# Patient Record
Sex: Male | Born: 1965 | Race: White | Hispanic: No | Marital: Married | Smoking: Never smoker
Health system: Southern US, Community
[De-identification: ages and names within clinical notes are randomized; demographics above are authoritative.]

## PROBLEM LIST (undated history)

## (undated) HISTORY — PX: HAND SURGERY: SHX662

## (undated) HISTORY — PX: APPENDECTOMY: SHX54

---

## 2012-07-29 ENCOUNTER — Ambulatory Visit (HOSPITAL_BASED_OUTPATIENT_CLINIC_OR_DEPARTMENT_OTHER)
Admission: RE | Admit: 2012-07-29 | Discharge: 2012-07-29 | Disposition: A | Discharge status: Home / Self Care | Payer: Self-pay | Source: Ambulatory Visit | Attending: Occupational Medicine | Admitting: Occupational Medicine

## 2012-07-29 ENCOUNTER — Other Ambulatory Visit: Payer: Self-pay | Admitting: Occupational Medicine

## 2012-07-29 DIAGNOSIS — Z0000 Encounter for general adult medical examination without abnormal findings: Secondary | ICD-10-CM

## 2016-06-10 ENCOUNTER — Emergency Department (INDEPENDENT_AMBULATORY_CARE_PROVIDER_SITE_OTHER): Payer: Managed Care, Other (non HMO)

## 2016-06-10 ENCOUNTER — Encounter: Payer: Self-pay | Admitting: Emergency Medicine

## 2016-06-10 ENCOUNTER — Emergency Department
Admission: EM | Admit: 2016-06-10 | Discharge: 2016-06-10 | Disposition: A | Discharge status: Home / Self Care | Payer: Managed Care, Other (non HMO) | Source: Home / Self Care | Attending: Family Medicine | Admitting: Family Medicine

## 2016-06-10 DIAGNOSIS — L03119 Cellulitis of unspecified part of limb: Secondary | ICD-10-CM

## 2016-06-10 DIAGNOSIS — M25522 Pain in left elbow: Secondary | ICD-10-CM | POA: Diagnosis not present

## 2016-06-10 MED ORDER — CLINDAMYCIN HCL 300 MG PO CAPS: 300.0000 mg | ORAL_CAPSULE | Freq: Four times a day (QID) | ORAL | Status: AC

## 2016-06-10 NOTE — Discharge Instructions (Signed)
Wear ace wrap until swelling resolves.  Apply heating pad several times daily.  May take Ibuprofen 200mg , 4 tabs every 8 hours with food.  If symptoms become significantly worse during the night or over the weekend, proceed to the local emergency room.    Cellulitis Cellulitis is an infection of the skin and the tissue beneath it. The infected area is usually red and tender. Cellulitis occurs most often in the arms and lower legs.  CAUSES  Cellulitis is caused by bacteria that enter the skin through cracks or cuts in the skin. The most common types of bacteria that cause cellulitis are staphylococci and streptococci. SIGNS AND SYMPTOMS   Redness and warmth.  Swelling.  Tenderness or pain.  Fever. DIAGNOSIS  Your health care provider can usually determine what is wrong based on a physical exam. Blood tests may also be done. TREATMENT  Treatment usually involves taking an antibiotic medicine. HOME CARE INSTRUCTIONS   Take your antibiotic medicine as directed by your health care provider. Finish the antibiotic even if you start to feel better.  Keep the infected arm or leg elevated to reduce swelling.  Apply a warm cloth to the affected area up to 4 times per day to relieve pain.  Take medicines only as directed by your health care provider.  Keep all follow-up visits as directed by your health care provider. SEEK MEDICAL CARE IF:   You notice red streaks coming from the infected area.  Your red area gets larger or turns dark in color.  Your bone or joint underneath the infected area becomes painful after the skin has healed.  Your infection returns in the same area or another area.  You notice a swollen bump in the infected area.  You develop new symptoms.  You have a fever. SEEK IMMEDIATE MEDICAL CARE IF:   You feel very sleepy.  You develop vomiting or diarrhea.  You have a general ill feeling (malaise) with muscle aches and pains.   This information is not  intended to replace advice given to you by your health care provider. Make sure you discuss any questions you have with your health care provider.   Document Released: 08/16/2005 Document Revised: 07/28/2015 Document Reviewed: 01/22/2012 Elsevier Interactive Patient Education [employer]

## 2016-06-10 NOTE — ED Notes (Signed)
Pt c/o left elbow pain from falling off a [place] on Thursday.  Elbow has redness and swelling, pain.  Pt can bend but cannot straighten out.

## 2016-07-11 NOTE — ED Provider Notes (Signed)
[facility]    CSN: [id number] Arrival date & time: 06/10/16  1329  First Provider Contact:  First MD Initiated Contact with Patient 06/10/16 1526        History   Chief Complaint Chief Complaint  Patient presents with  . Elbow Pain    HPI Mario Melendez is a 50 y.o. male.   Patient fell [place] two days ago, striking his left elbow on a creek bottom.  He has had persistent pain/swelling in his elbow, and developed fever/chills last night.   The history is provided by the patient.  Arm Injury  Location:  Elbow Elbow location:  L elbow Injury: yes   Time since incident:  2 days Mechanism of injury: fall   Fall:    Fall occurred: from a [place].   Impact surface: creek bed. Pain details:    Quality:  Aching   Radiates to:  Does not radiate   Severity:  Moderate   Onset quality:  Sudden   Timing:  Constant   Progression:  Worsening Foreign body present:  No foreign bodies Prior injury to area:  No Relieved by:  Nothing Exacerbated by: extending elbow. Ineffective treatments:  NSAIDs Associated symptoms: decreased range of motion, fever, stiffness and swelling   Associated symptoms: no numbness and no tingling     History reviewed. No pertinent past medical history.  There are no active problems to display for this patient.   Past Surgical History:  Procedure Laterality Date  . APPENDECTOMY    . HAND SURGERY         Home Medications    Prior to Admission medications   Medication Sig Start Date End Date Taking? Authorizing Provider  clindamycin (CLEOCIN) 300 MG capsule Take 1 capsule (300 mg total) by mouth 4 (four) times daily. (every 6 hours) 06/10/16   Lattie HawStephen A Geetika Laborde, MD    Family History No family history on file.  Social History Social History  Substance Use Topics  . Smoking status: Never Smoker  . Smokeless tobacco: Not on file  . Alcohol use Yes     Comment: 1 drink per week     Allergies   Review of patient's  allergies indicates no known allergies.   Review of Systems Review of Systems  Constitutional: Positive for fever.  Musculoskeletal: Positive for stiffness.  All other systems reviewed and are negative.    Physical Exam Triage Vital Signs ED Triage Vitals  Enc Vitals Group     BP 06/10/16 1358 129/78     Pulse Rate 06/10/16 1358 61     Resp --      Temp 06/10/16 1358 98.6 F (37 C)     Temp Source 06/10/16 1358 Oral     SpO2 06/10/16 1358 97 %     Weight 06/10/16 1358 275 lb 8 oz (125 kg)     Height 06/10/16 1358 5\' 11"  (1.803 m)     Head Circumference --      Peak Flow --      Pain Score 06/10/16 1400 6     Pain Loc --      Pain Edu? --      Excl. in GC? --    No data found.   Updated Vital Signs BP 129/78 (BP Location: Left Arm)   Pulse 61   Temp 98.6 F (37 C) (Oral)   Ht 5\' 11"  (1.803 m)   Wt 275 lb 8 oz (125 kg)   SpO2 97%  BMI 38.42 kg/m   Visual Acuity Right Eye Distance:   Left Eye Distance:   Bilateral Distance:    Right Eye Near:   Left Eye Near:    Bilateral Near:     Physical Exam  Constitutional: He appears well-developed and well-nourished. No distress.  HENT:  Head: Atraumatic.  Eyes: Pupils are equal, round, and reactive to light.  Neck: Neck supple.  Cardiovascular: Normal heart sounds.   Pulmonary/Chest: Breath sounds normal.  Musculoskeletal:       Left elbow: He exhibits decreased range of motion and swelling. Tenderness found.       Arms: Left elbow has diffuse swelling, erythema, warmth, and tenderness to palpation superficially over olecranon.  Olecranon bursa is not swollen.  Patient has difficulty fully extending elbow.  Distal neurovascular function is intact.   Neurological: He is alert.  Skin: Skin is warm and dry.  Nursing note and vitals reviewed.    UC Treatments / Results  Labs (all labs ordered are listed, but only abnormal results are displayed) Labs Reviewed - No data to display  EKG  EKG  Interpretation None       Radiology Study Result   CLINICAL DATA:  Fall from [place] on Thursday, left elbow injury, posterior pain with swelling and redness.  EXAM: LEFT ELBOW - COMPLETE 3+ VIEW  COMPARISON:  None.  FINDINGS: Osseous alignment is normal. No fracture line or displaced fracture fragment seen. No degenerative change. No appreciable joint effusion. Soft tissue swelling/edema noted posteriorly.  IMPRESSION: Soft tissue swelling.  No osseous fracture or dislocation.   Electronically Signed   By: Bary RichardStan  Maynard M.D.   On: 06/10/2016 15:27     Procedures Procedures (including critical care time)  Medications Ordered in UC Medications - No data to display   Initial Impression / Assessment and Plan / UC Course  I have reviewed the triage vital signs and the nursing notes.  Pertinent labs & imaging results that were available during my care of the patient were reviewed by me and considered in my medical decision making (see chart for details).  Clinical Course   Begin Clindaymcin.  May continue Ibuprofen 200mg , 4 tabs every 8 hours with food.  Begin applying warm compress or heating pad several times daily. Followup with Dr. Rodney Langtonhomas Thekkekandam or Dr. Clementeen GrahamEvan Corey ([facility]) if not improving about one week. If symptoms become significantly worse during the night or over the weekend, proceed to the local emergency room.   Final Clinical Impressions(s) / UC Diagnoses   Final diagnoses:  Cellulitis of elbow    New Prescriptions Discharge Medication List as of 06/10/2016  3:48 PM    START taking these medications   Details  clindamycin (CLEOCIN) 300 MG capsule Take 1 capsule (300 mg total) by mouth 4 (four) times daily. (every 6 hours), Starting 06/10/2016, Until Discontinued, Print         Lattie HawStephen A Tiburcio Linder, MD 07/11/16 [phone]

## 2016-08-03 ENCOUNTER — Emergency Department (INDEPENDENT_AMBULATORY_CARE_PROVIDER_SITE_OTHER)
Admission: EM | Admit: 2016-08-03 | Discharge: 2016-08-03 | Disposition: A | Discharge status: Home / Self Care | Payer: Managed Care, Other (non HMO) | Source: Home / Self Care | Attending: Family Medicine | Admitting: Family Medicine

## 2016-08-03 ENCOUNTER — Emergency Department (INDEPENDENT_AMBULATORY_CARE_PROVIDER_SITE_OTHER): Payer: Managed Care, Other (non HMO)

## 2016-08-03 DIAGNOSIS — W19XXXA Unspecified fall, initial encounter: Secondary | ICD-10-CM

## 2016-08-03 DIAGNOSIS — S52612A Displaced fracture of left ulna styloid process, initial encounter for closed fracture: Secondary | ICD-10-CM | POA: Diagnosis not present

## 2016-08-03 DIAGNOSIS — T07XXXA Unspecified multiple injuries, initial encounter: Secondary | ICD-10-CM

## 2016-08-03 DIAGNOSIS — W309XXA Contact with unspecified agricultural machinery, initial encounter: Secondary | ICD-10-CM

## 2016-08-03 DIAGNOSIS — S6992XA Unspecified injury of left wrist, hand and finger(s), initial encounter: Secondary | ICD-10-CM | POA: Diagnosis not present

## 2016-08-03 NOTE — ED Provider Notes (Signed)
CSN: [id number]     Arrival date & time 08/03/16  1854 History   First MD Initiated Contact with Patient 08/03/16 1921     Chief Complaint  Patient presents with  . Fall   (Consider location/radiation/quality/duration/timing/severity/associated sxs/prior Treatment) HPI Jocelyn LamerJames Rowzee is a 50 y.o. male presenting to UC with c/o Left wrist pain and swelling that started around 6PM today after pt jumped off farm equipment he was on that was being pulled by mules.  Pt states the mules were getting caught up in part of the cart he was on so he decided to jump off before he got more hurt.  He now has superficial scrapes to Left side of face, Left side/anterior neck pain from stretching his neck during the fall, Left side pain and Left wrist pain that is most bothersome for the pt. Denies LOC. Denies dizziness or change in vision. He did take Tramadol PTA (which he has for chronic Right hand pain).  Pain at this time is mild to moderate pressure. Denies numbness or weakness to arms or legs. No back pain.   History reviewed. No pertinent past medical history. Past Surgical History:  Procedure Laterality Date  . APPENDECTOMY    . HAND SURGERY     History reviewed. No pertinent family history. Social History  Substance Use Topics  . Smoking status: Never Smoker  . Smokeless tobacco: Never Used  . Alcohol use Yes     Comment: 1 drink per week    Review of Systems  Musculoskeletal: Positive for arthralgias, joint swelling, myalgias and neck pain. Negative for back pain, gait problem and neck stiffness.  Skin: Positive for wound. Negative for color change and rash.  Neurological: Negative for weakness and numbness.    Allergies  Hydrocodone  Home Medications   Prior to Admission medications   Medication Sig Start Date End Date Taking? Authorizing Provider  clindamycin (CLEOCIN) 300 MG capsule Take 1 capsule (300 mg total) by mouth 4 (four) times daily. (every 6 hours) 06/10/16   Lattie HawStephen A  Beese, MD   Meds Ordered and Administered this Visit  Medications - No data to display  BP 142/97 (BP Location: Right Arm)   Pulse 82   Temp 98.1 F (36.7 C) (Oral)   Ht 5\' 11"  (1.803 m)   Wt 267 lb (121.1 kg)   SpO2 97%   BMI 37.24 kg/m  No data found.   Physical Exam  Constitutional: He is oriented to person, place, and time. He appears well-developed and well-nourished.  HENT:  Head: Normocephalic and atraumatic.  Eyes: EOM are normal.  Neck: Normal range of motion.  Cardiovascular: Normal rate.   Pulmonary/Chest: Effort normal.  Musculoskeletal: Normal range of motion. He exhibits edema and tenderness.  Left wrist: mild edema to radial dorsal aspect of wrist, tender. Full ROM. No snuffbox tenderness.  Full ROM elbow and shoulder w/o pain. 5/5 grip strength.  Neurological: He is alert and oriented to person, place, and time.  Skin: Skin is warm and dry. Capillary refill takes less than 2 seconds.  Left side of face: multiple superficial abrasions.   Psychiatric: He has a normal mood and affect. His behavior is normal.  Nursing note and vitals reviewed.   Urgent Care Course   Clinical Course    Procedures (including critical care time)  Labs Review Labs Reviewed - No data to display  Imaging Review Dg Wrist Complete Left  Result Date: 08/03/2016 CLINICAL DATA:  Left wrist pain and swelling after  injury landing on left side. EXAM: LEFT WRIST - COMPLETE 3+ VIEW COMPARISON:  None. FINDINGS: Transverse sclerotic band in the distal radius metaphysis suspicious for impaction fracture, acuity uncertain. There is a remote appearing ulna styloid fracture. Carpal bones including the scaphoid is intact. Soft tissue edema noted about the wrist IMPRESSION: Transverse sclerotic band in the distal radius is suspicious for impaction fracture, acuity uncertain. This may be acute or sequela of remote distal radius fracture. Remote appearing ulna styloid fracture. Electronically  Signed   By: Rubye Oaks M.D.   On: 08/03/2016 20:11     MDM   1. Left wrist injury, initial encounter   2. Fall, initial encounter   3. Abrasions of multiple sites    Fall from farm equipment. No LOC. Superficial abrasions to Left side of face. Pt's main concern, Left wrist.  Plain films: questionable impaction fracture with transverse sclerotic band in distal radius  Discussed imaging with pt.  Pt agreeable to wear thumb spica wrist splint for at least 2 weeks. If pain not improving, will f/u with Sports Medicine for recheck of symptoms, sooner if worsening. Patient verbalized understanding and agreement with treatment plan. Pt has tramadol at home as needed for pain.     Junius Finner, PA-C 08/04/16 1209

## 2016-08-03 NOTE — ED Triage Notes (Signed)
Patient state he was moving mules and fell off of one, landed on his left side, complains of left side pain, left arm,wrist,neck and shoulder pain. States more pressure than pain. States took Tramadol unsure of dosage, fall happened at approximately 5:30/6:30 today

## 2017-09-07 IMAGING — DX DG WRIST COMPLETE 3+V*L*
4 series · 4 of 4 positions shown · non-contrast
Comparison: None.

CLINICAL DATA: Left wrist pain and swelling after injury landing on
left side.

EXAM:
LEFT WRIST - COMPLETE 3+ VIEW

[wrist pa]
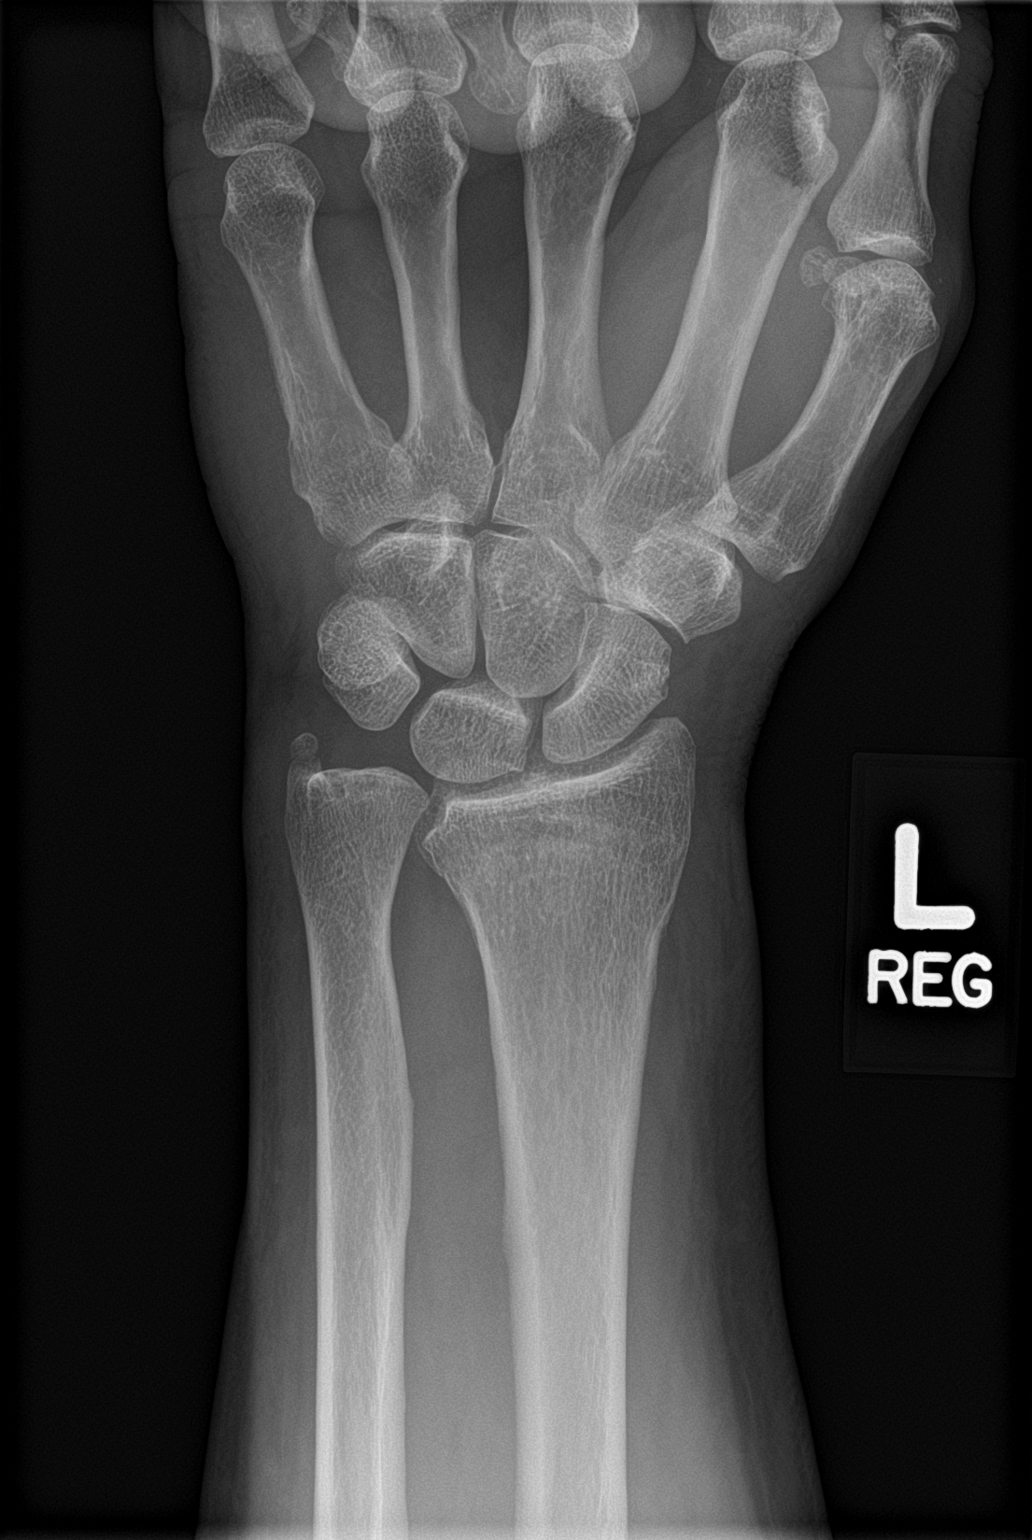

[wrist obl]
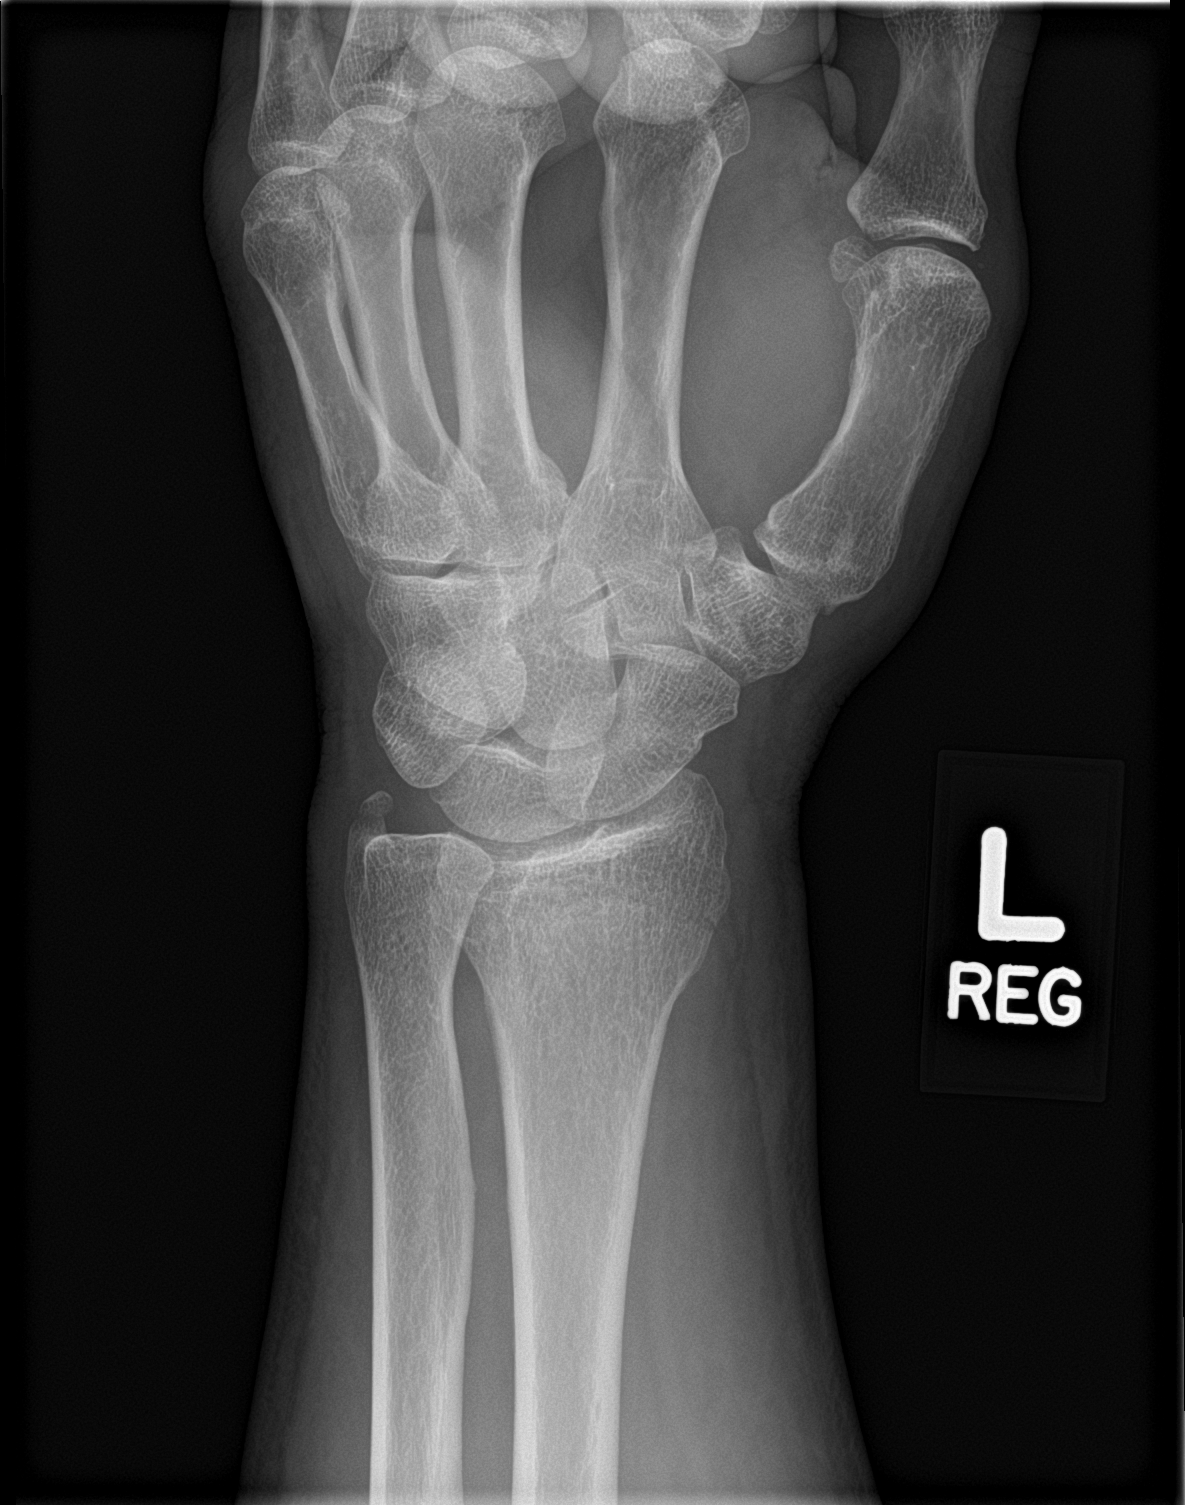

[wrist lat]
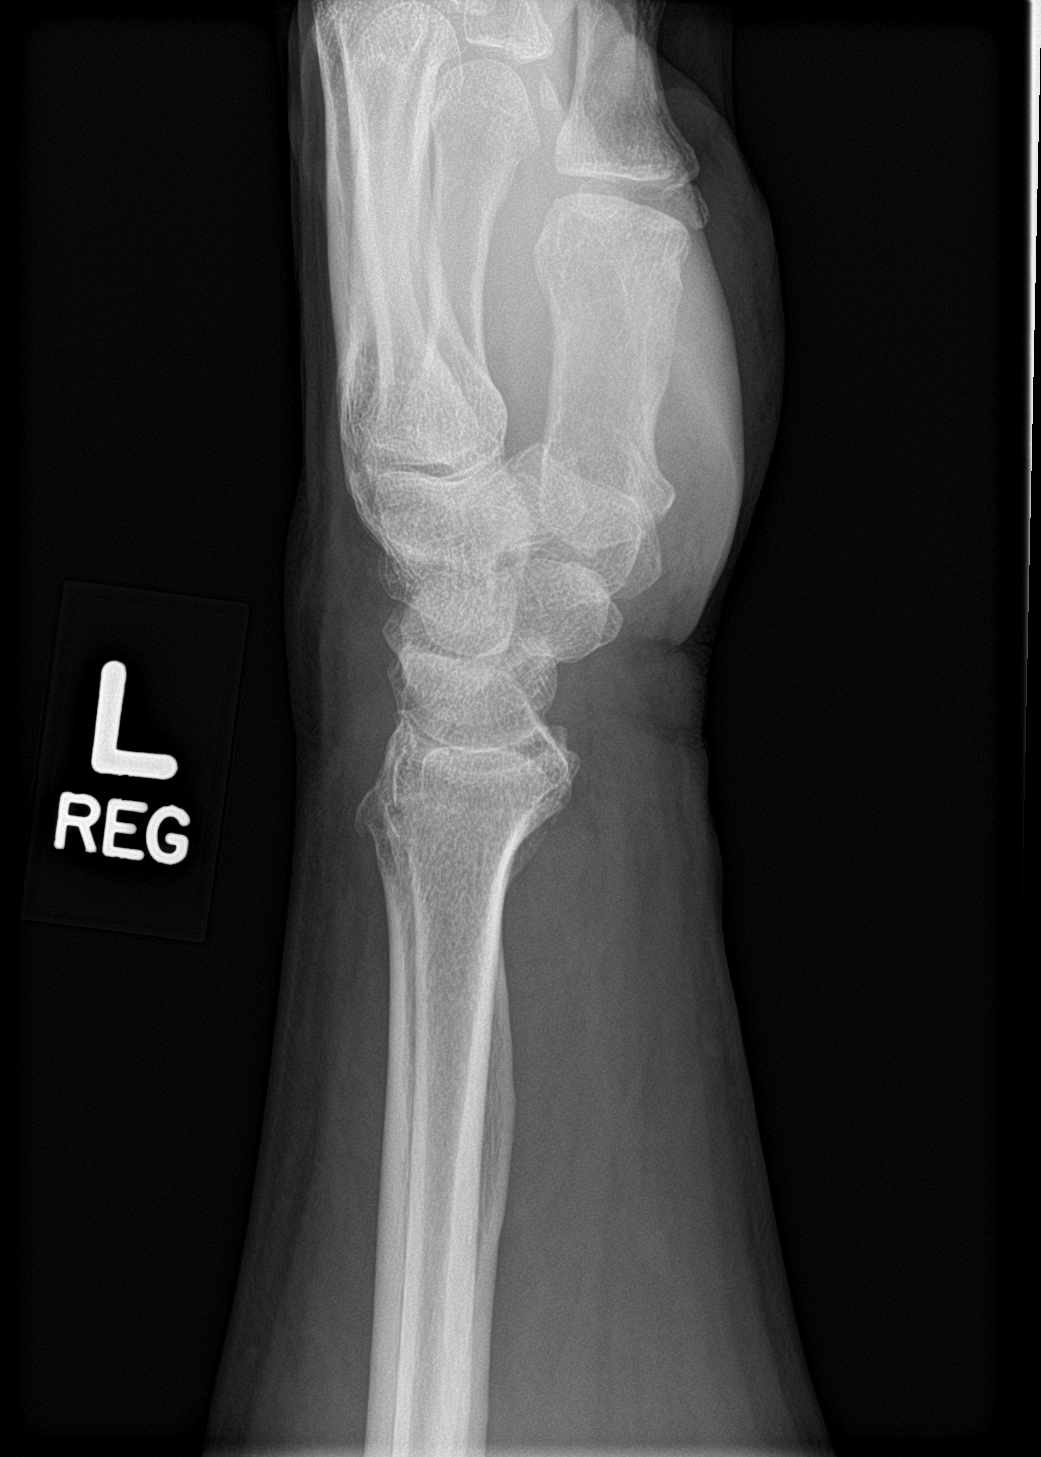

[wrist navicular]
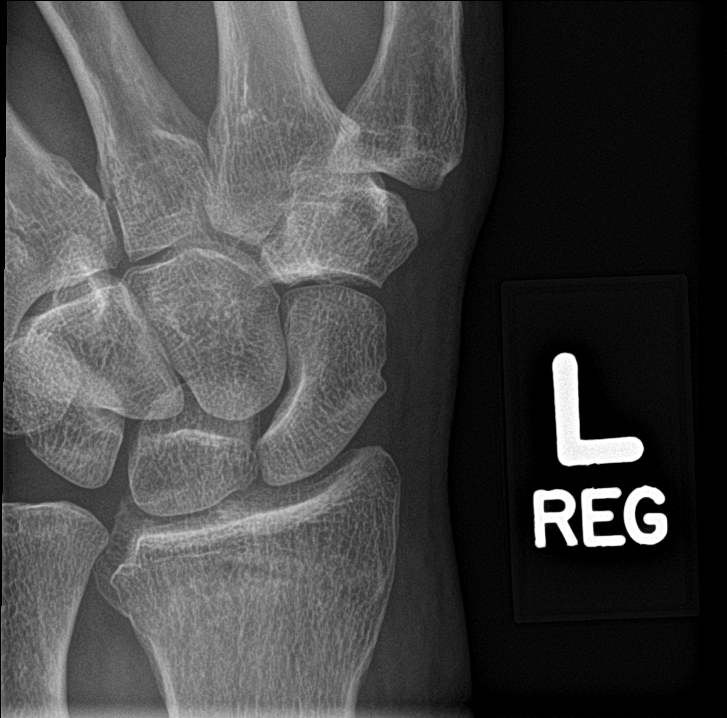

[4 of 4 positions shown; findings below may reference images not displayed]

FINDINGS: Transverse sclerotic band in the distal radius metaphysis suspicious
for impaction fracture, acuity uncertain. There is a remote
appearing ulna styloid fracture. Carpal bones including the scaphoid
is intact. Soft tissue edema noted about the wrist
IMPRESSION: Transverse sclerotic band in the distal radius is suspicious for
impaction fracture, acuity uncertain. This may be acute or sequela
of remote distal radius fracture. Remote appearing ulna styloid
fracture.

## 2022-03-24 ENCOUNTER — Emergency Department
Admission: EM | Admit: 2022-03-24 | Discharge: 2022-03-24 | Discharge status: Absent without leave | Payer: Managed Care, Other (non HMO) | Source: Home / Self Care
